# Patient Record
Sex: Female | Born: 1945 | Race: White | Hispanic: No | Marital: Married
Health system: Southern US, Community
[De-identification: ages and names within clinical notes are randomized; demographics above are authoritative.]

## PROBLEM LIST (undated history)

## (undated) HISTORY — PX: ABDOMINAL HYSTERECTOMY: SHX81

---

## 2004-11-24 ENCOUNTER — Ambulatory Visit: Payer: Self-pay | Admitting: Internal Medicine

## 2006-01-04 ENCOUNTER — Ambulatory Visit: Payer: Self-pay | Admitting: Internal Medicine

## 2007-04-04 ENCOUNTER — Ambulatory Visit: Payer: Self-pay | Admitting: Obstetrics and Gynecology

## 2008-06-19 ENCOUNTER — Ambulatory Visit: Payer: Self-pay | Admitting: Family Medicine

## 2009-10-15 ENCOUNTER — Ambulatory Visit: Payer: Self-pay | Admitting: Family Medicine

## 2011-01-15 ENCOUNTER — Ambulatory Visit: Payer: Self-pay | Admitting: Internal Medicine

## 2011-09-12 ENCOUNTER — Ambulatory Visit: Payer: Self-pay | Admitting: Family Medicine

## 2011-09-12 ENCOUNTER — Ambulatory Visit: Payer: Self-pay

## 2011-12-26 ENCOUNTER — Ambulatory Visit: Payer: Self-pay

## 2012-06-06 ENCOUNTER — Ambulatory Visit: Payer: Self-pay | Admitting: Family Medicine

## 2012-06-06 LAB — URINALYSIS, COMPLETE
Bilirubin,UR: NEGATIVE
Ph: 6.5 (ref 4.5–8.0)
Protein: 30

## 2012-06-08 LAB — URINE CULTURE

## 2012-08-09 ENCOUNTER — Ambulatory Visit: Payer: Self-pay | Admitting: Family Medicine

## 2012-08-30 ENCOUNTER — Ambulatory Visit: Payer: Self-pay

## 2012-08-30 LAB — URINALYSIS, COMPLETE
Bilirubin,UR: NEGATIVE
Glucose,UR: NEGATIVE mg/dL (ref 0–75)
Ketone: NEGATIVE
Nitrite: NEGATIVE
Protein: NEGATIVE
Specific Gravity: 1.015 (ref 1.003–1.030)
WBC UR: >30 /HPF (ref 0–5)

## 2012-09-01 LAB — URINE CULTURE

## 2012-12-25 ENCOUNTER — Ambulatory Visit: Payer: Self-pay | Admitting: Emergency Medicine

## 2013-03-22 ENCOUNTER — Ambulatory Visit: Payer: Self-pay | Admitting: Family Medicine

## 2013-03-22 LAB — URINALYSIS, COMPLETE
Glucose,UR: NEGATIVE mg/dL (ref 0–75)
Ph: 7.5 (ref 4.5–8.0)
Specific Gravity: 1.005 (ref 1.003–1.030)

## 2013-03-24 LAB — URINE CULTURE

## 2013-05-17 ENCOUNTER — Ambulatory Visit: Payer: Self-pay | Admitting: Emergency Medicine

## 2013-06-19 ENCOUNTER — Ambulatory Visit: Payer: Self-pay | Admitting: Family Medicine

## 2013-07-02 ENCOUNTER — Ambulatory Visit: Payer: Self-pay | Admitting: Family Medicine

## 2013-10-01 ENCOUNTER — Ambulatory Visit: Payer: Self-pay

## 2013-10-01 LAB — URINALYSIS, COMPLETE
Bilirubin,UR: NEGATIVE
Glucose,UR: NEGATIVE mg/dL (ref 0–75)
Ketone: NEGATIVE
Nitrite: POSITIVE
Ph: 6.5 (ref 4.5–8.0)
Protein: NEGATIVE
Specific Gravity: 1.01 (ref 1.003–1.030)
WBC UR: >30 /HPF (ref 0–5)

## 2013-10-03 LAB — URINE CULTURE

## 2014-03-31 ENCOUNTER — Ambulatory Visit: Payer: Self-pay | Admitting: Emergency Medicine

## 2015-01-19 ENCOUNTER — Ambulatory Visit
Admit: 2015-01-19 | Disposition: A | Discharge status: Home / Self Care | Payer: Self-pay | Attending: Family Medicine | Admitting: Family Medicine

## 2015-07-09 ENCOUNTER — Other Ambulatory Visit: Payer: Self-pay | Admitting: Family Medicine

## 2015-07-09 DIAGNOSIS — Z1231 Encounter for screening mammogram for malignant neoplasm of breast: Secondary | ICD-10-CM

## 2015-07-16 ENCOUNTER — Ambulatory Visit: Payer: Self-pay

## 2015-07-22 ENCOUNTER — Ambulatory Visit
Admission: RE | Admit: 2015-07-22 | Discharge: 2015-07-22 | Disposition: A | Discharge status: Home / Self Care | Payer: Medicare Other | Source: Ambulatory Visit | Attending: Family Medicine | Admitting: Family Medicine

## 2015-07-22 ENCOUNTER — Other Ambulatory Visit: Payer: Self-pay | Admitting: Family Medicine

## 2015-07-22 DIAGNOSIS — Z1231 Encounter for screening mammogram for malignant neoplasm of breast: Secondary | ICD-10-CM

## 2017-06-13 ENCOUNTER — Other Ambulatory Visit: Payer: Self-pay | Admitting: Family Medicine

## 2017-06-13 DIAGNOSIS — Z1231 Encounter for screening mammogram for malignant neoplasm of breast: Secondary | ICD-10-CM

## 2017-06-29 ENCOUNTER — Ambulatory Visit
Admission: RE | Admit: 2017-06-29 | Discharge: 2017-06-29 | Disposition: A | Discharge status: Home / Self Care | Payer: Medicare Other | Source: Ambulatory Visit | Attending: Family Medicine | Admitting: Family Medicine

## 2017-06-29 DIAGNOSIS — Z1231 Encounter for screening mammogram for malignant neoplasm of breast: Secondary | ICD-10-CM | POA: Diagnosis not present

## 2017-06-30 ENCOUNTER — Other Ambulatory Visit: Payer: Self-pay | Admitting: Family Medicine

## 2017-06-30 DIAGNOSIS — R928 Other abnormal and inconclusive findings on diagnostic imaging of breast: Secondary | ICD-10-CM

## 2017-07-13 ENCOUNTER — Ambulatory Visit
Admission: RE | Admit: 2017-07-13 | Discharge: 2017-07-13 | Disposition: A | Discharge status: Home / Self Care | Payer: Medicare Other | Source: Ambulatory Visit | Attending: Family Medicine | Admitting: Family Medicine

## 2017-07-13 ENCOUNTER — Other Ambulatory Visit: Payer: Self-pay | Admitting: Family Medicine

## 2017-07-13 DIAGNOSIS — R921 Mammographic calcification found on diagnostic imaging of breast: Secondary | ICD-10-CM | POA: Diagnosis present

## 2017-07-13 DIAGNOSIS — R928 Other abnormal and inconclusive findings on diagnostic imaging of breast: Secondary | ICD-10-CM

## 2018-02-08 ENCOUNTER — Other Ambulatory Visit: Payer: Self-pay | Admitting: Family Medicine

## 2018-02-08 DIAGNOSIS — R921 Mammographic calcification found on diagnostic imaging of breast: Secondary | ICD-10-CM

## 2018-02-22 ENCOUNTER — Ambulatory Visit
Admission: RE | Admit: 2018-02-22 | Discharge: 2018-02-22 | Disposition: A | Discharge status: Home / Self Care | Payer: Medicare Other | Source: Ambulatory Visit | Attending: Family Medicine | Admitting: Family Medicine

## 2018-02-22 DIAGNOSIS — R921 Mammographic calcification found on diagnostic imaging of breast: Secondary | ICD-10-CM | POA: Diagnosis present

## 2018-08-30 ENCOUNTER — Ambulatory Visit: Payer: Medicare Other | Admitting: Podiatry

## 2018-09-19 ENCOUNTER — Ambulatory Visit: Payer: Medicare Other | Admitting: Podiatry

## 2019-05-28 ENCOUNTER — Other Ambulatory Visit: Payer: Self-pay | Admitting: Family Medicine

## 2019-05-28 DIAGNOSIS — Z1231 Encounter for screening mammogram for malignant neoplasm of breast: Secondary | ICD-10-CM

## 2019-07-01 ENCOUNTER — Ambulatory Visit
Admission: RE | Admit: 2019-07-01 | Discharge: 2019-07-01 | Disposition: A | Discharge status: Home / Self Care | Payer: Medicare HMO | Source: Ambulatory Visit | Attending: Family Medicine | Admitting: Family Medicine

## 2019-07-01 DIAGNOSIS — Z1231 Encounter for screening mammogram for malignant neoplasm of breast: Secondary | ICD-10-CM

## 2019-11-22 ENCOUNTER — Ambulatory Visit: Payer: Medicare HMO | Attending: Internal Medicine

## 2019-11-22 ENCOUNTER — Other Ambulatory Visit: Payer: Self-pay

## 2019-11-22 DIAGNOSIS — Z23 Encounter for immunization: Secondary | ICD-10-CM | POA: Insufficient documentation

## 2019-11-22 NOTE — Progress Notes (Signed)
   Covid-19 Vaccination Clinic  Name:  Sharon Green    MRN: [id number] DOB: [birth year]  11/22/2019  Sharon Green was observed post Covid-19 immunization for 15 minutes without incidence. She was provided with Vaccine Information Sheet and instruction to access the V-Safe system.   Sharon Green was instructed to call 911 with any severe reactions post vaccine: Marland Kitchen Difficulty breathing  . Swelling of your face and throat  . A fast heartbeat  . A bad rash all over your body  . Dizziness and weakness    Immunizations Administered    Name Date Dose VIS Date Route   Moderna COVID-19 Vaccine 11/22/2019  4:35 PM 0.5 mL 09/17/2019 Intramuscular   Manufacturer: Moderna   Lot: [id number]   NDC: 17408-144-81

## 2019-12-24 ENCOUNTER — Ambulatory Visit: Payer: Medicare HMO | Attending: Internal Medicine

## 2019-12-24 DIAGNOSIS — Z23 Encounter for immunization: Secondary | ICD-10-CM | POA: Insufficient documentation

## 2019-12-24 NOTE — Progress Notes (Signed)
   Covid-19 Vaccination Clinic  Name:  Sharon Green    MRN: [id number] DOB: 06-Mar-1946  12/24/2019  [name] was observed post Covid-19 immunization for 15 minutes without incident. She was provided with Vaccine Information Sheet and instruction to access the V-Safe system.   [name] was instructed to call 911 with any severe reactions post vaccine: Marland Kitchen Difficulty breathing  . Swelling of face and throat  . A fast heartbeat  . A bad rash all over body  . Dizziness and weakness   Immunizations Administered    Name Date Dose VIS Date Route   Moderna COVID-19 Vaccine 12/24/2019  1:04 PM 0.5 mL 09/17/2019 Intramuscular   Manufacturer: Moderna   Lot: [id number]   NDC: 31250-871-99

## 2020-11-17 ENCOUNTER — Other Ambulatory Visit: Payer: Self-pay | Admitting: Family Medicine

## 2020-11-17 DIAGNOSIS — Z1231 Encounter for screening mammogram for malignant neoplasm of breast: Secondary | ICD-10-CM

## 2020-12-09 ENCOUNTER — Ambulatory Visit
Admission: RE | Admit: 2020-12-09 | Discharge: 2020-12-09 | Disposition: A | Discharge status: Home / Self Care | Payer: Medicare HMO | Source: Ambulatory Visit | Attending: Family Medicine | Admitting: Family Medicine

## 2020-12-09 ENCOUNTER — Other Ambulatory Visit: Payer: Self-pay

## 2020-12-09 DIAGNOSIS — Z1231 Encounter for screening mammogram for malignant neoplasm of breast: Secondary | ICD-10-CM | POA: Insufficient documentation

## 2021-12-28 ENCOUNTER — Other Ambulatory Visit: Payer: Self-pay | Admitting: Family Medicine

## 2021-12-28 DIAGNOSIS — Z1231 Encounter for screening mammogram for malignant neoplasm of breast: Secondary | ICD-10-CM

## 2021-12-30 ENCOUNTER — Other Ambulatory Visit: Payer: Self-pay

## 2021-12-30 ENCOUNTER — Ambulatory Visit
Admission: RE | Admit: 2021-12-30 | Discharge: 2021-12-30 | Disposition: A | Discharge status: Home / Self Care | Payer: Medicare HMO | Source: Ambulatory Visit | Attending: Family Medicine | Admitting: Family Medicine

## 2021-12-30 DIAGNOSIS — Z1231 Encounter for screening mammogram for malignant neoplasm of breast: Secondary | ICD-10-CM | POA: Insufficient documentation

## 2022-08-18 IMAGING — MG MM DIGITAL SCREENING BILAT W/ TOMO AND CAD
8 series · 8 of 24 positions shown · non-contrast
Comparison: Previous exam(s).

CLINICAL DATA: Screening.

EXAM:
DIGITAL SCREENING BILATERAL MAMMOGRAM WITH TOMOSYNTHESIS AND CAD
TECHNIQUE: Bilateral screening digital craniocaudal and mediolateral oblique
mammograms were obtained. Bilateral screening digital breast
tomosynthesis was performed. The images were evaluated with
computer-aided detection.

[R MLO synth-2D]
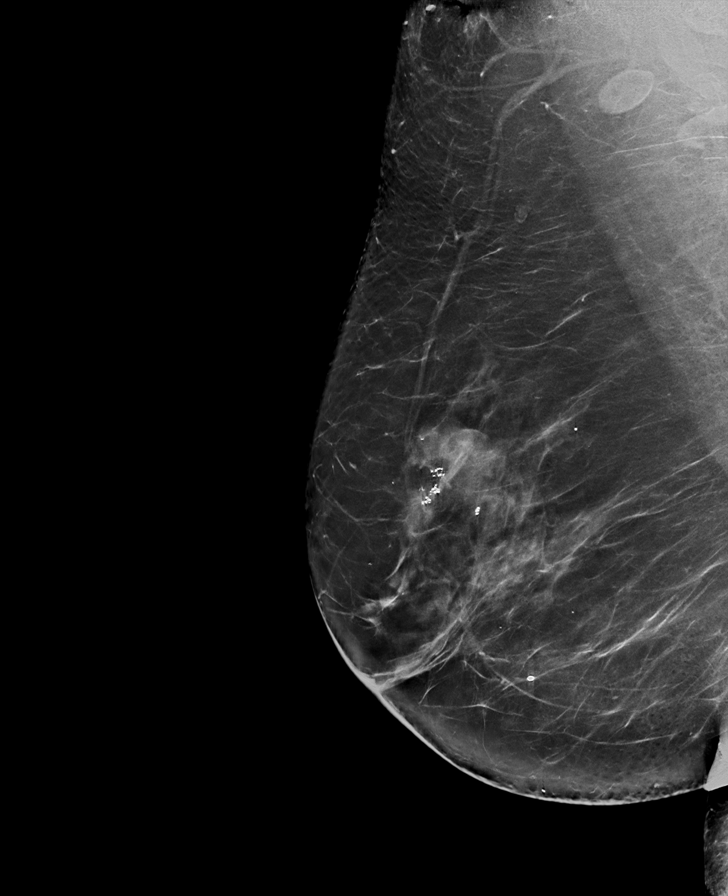

[R CC synth-2D]
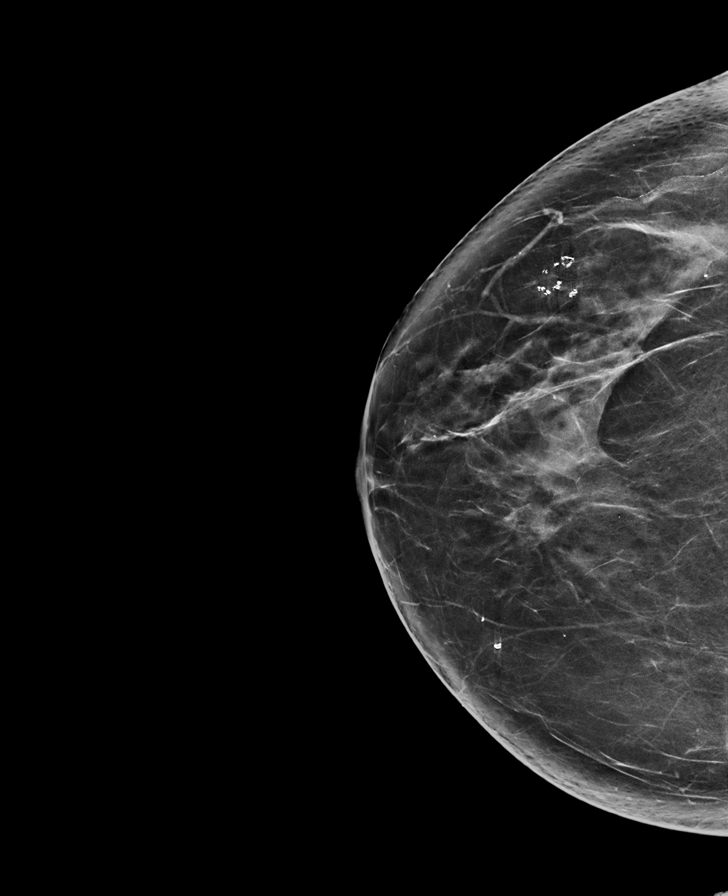

[L MLO synth-2D]
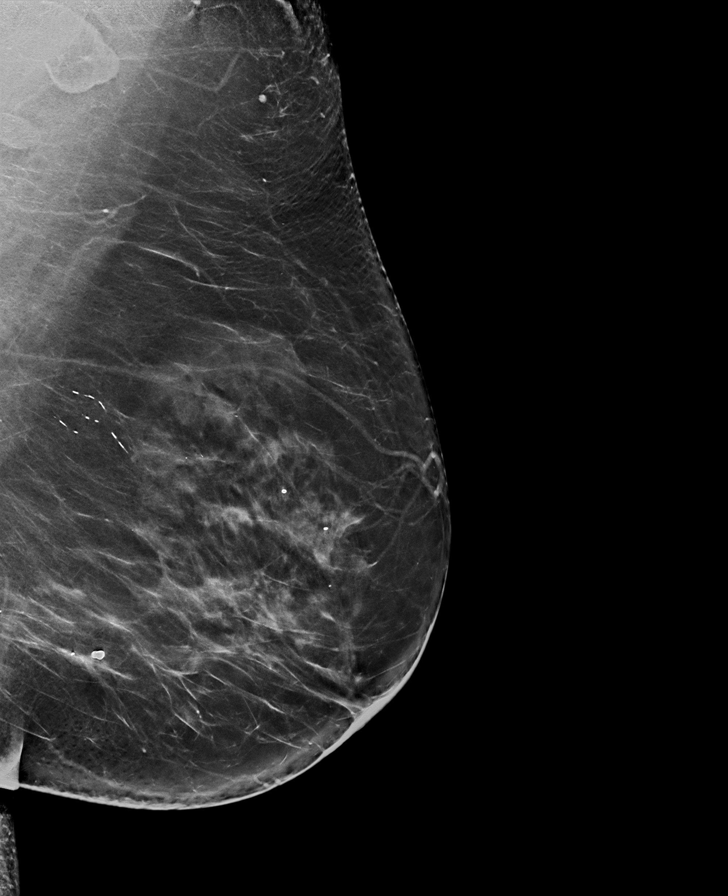

[L CC synth-2D]
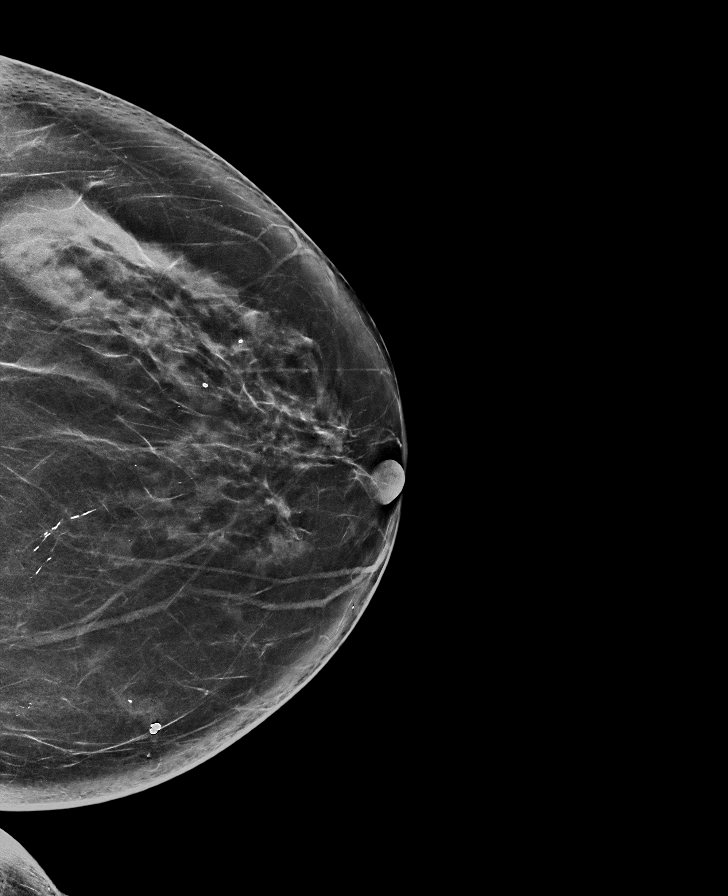

[R CC tomo · tomo slice 41/81.0]
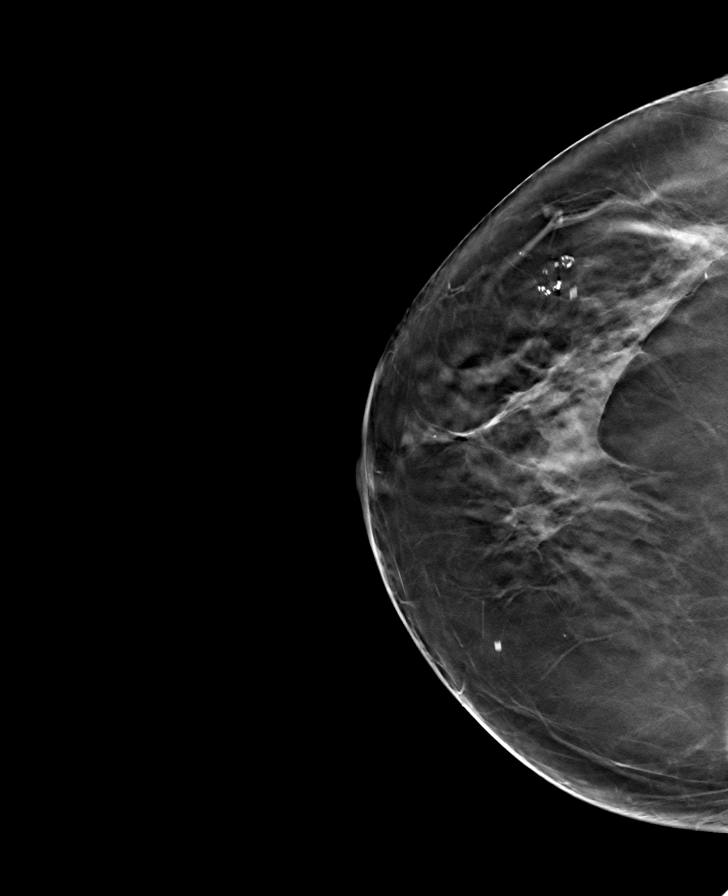

[R MLO tomo · tomo slice 49/96.0]
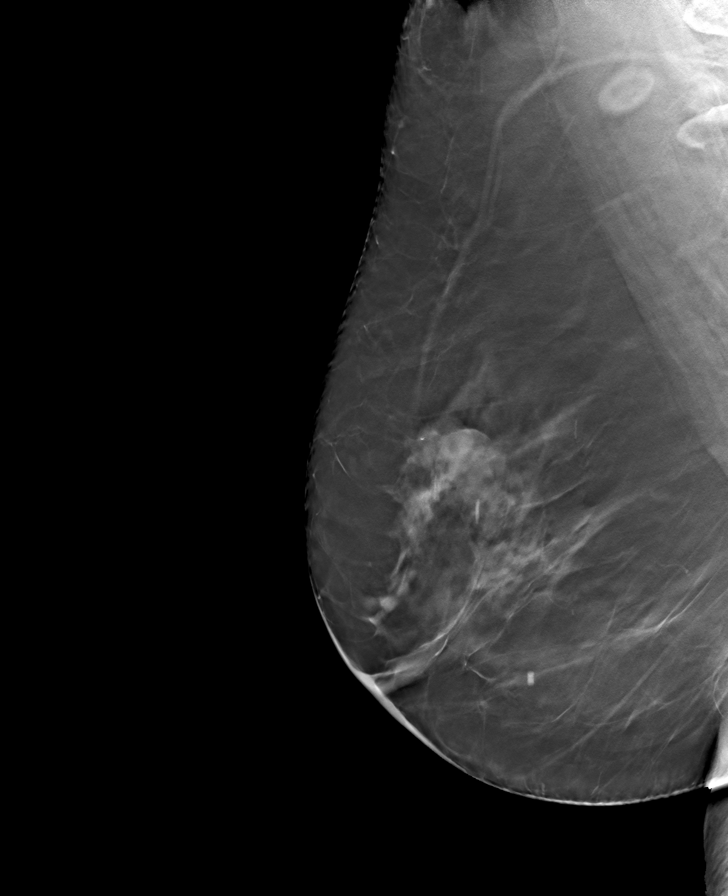

[L MLO tomo · tomo slice 47/94.0]
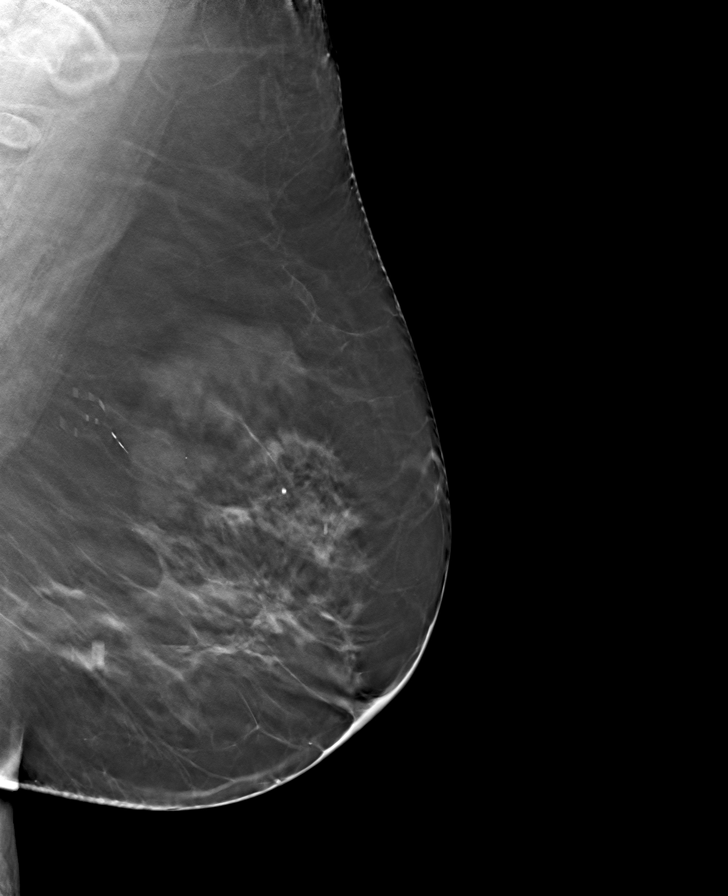

[L CC tomo · tomo slice 43/85.0]
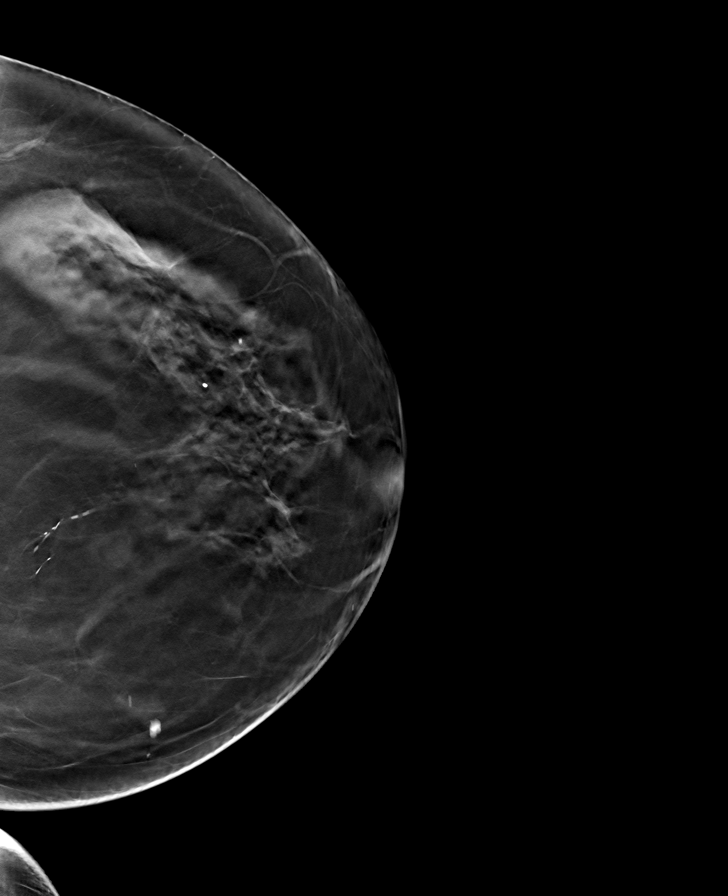

[8 of 24 positions shown; findings below may reference images not displayed]

ACR Breast Density Category c: The breast tissue is heterogeneously
dense, which may obscure small masses.
FINDINGS: There are no findings suspicious for malignancy.
IMPRESSION: No mammographic evidence of malignancy. A result letter of this
screening mammogram will be mailed directly to the patient.

RECOMMENDATION:
Screening mammogram in one year. (Code:[id number])

BI-RADS CATEGORY  1: Negative.

## 2023-02-21 ENCOUNTER — Other Ambulatory Visit: Payer: Self-pay | Admitting: Family Medicine

## 2023-02-21 DIAGNOSIS — Z1231 Encounter for screening mammogram for malignant neoplasm of breast: Secondary | ICD-10-CM

## 2023-02-23 ENCOUNTER — Ambulatory Visit
Admission: RE | Admit: 2023-02-23 | Discharge: 2023-02-23 | Disposition: A | Discharge status: Home / Self Care | Payer: Medicare HMO | Source: Ambulatory Visit | Attending: Family Medicine | Admitting: Family Medicine

## 2023-02-23 DIAGNOSIS — Z1231 Encounter for screening mammogram for malignant neoplasm of breast: Secondary | ICD-10-CM

## 2023-03-01 ENCOUNTER — Other Ambulatory Visit: Payer: Self-pay | Admitting: Family Medicine

## 2023-03-01 DIAGNOSIS — R928 Other abnormal and inconclusive findings on diagnostic imaging of breast: Secondary | ICD-10-CM

## 2023-03-01 DIAGNOSIS — R921 Mammographic calcification found on diagnostic imaging of breast: Secondary | ICD-10-CM

## 2023-03-02 ENCOUNTER — Ambulatory Visit
Admission: RE | Admit: 2023-03-02 | Discharge: 2023-03-02 | Disposition: A | Discharge status: Home / Self Care | Payer: Medicare HMO | Source: Ambulatory Visit | Attending: Family Medicine | Admitting: Family Medicine

## 2023-03-02 DIAGNOSIS — R928 Other abnormal and inconclusive findings on diagnostic imaging of breast: Secondary | ICD-10-CM | POA: Diagnosis not present

## 2023-03-02 DIAGNOSIS — R921 Mammographic calcification found on diagnostic imaging of breast: Secondary | ICD-10-CM | POA: Insufficient documentation

## 2023-03-06 ENCOUNTER — Other Ambulatory Visit: Payer: Self-pay | Admitting: Family Medicine

## 2023-03-06 DIAGNOSIS — R921 Mammographic calcification found on diagnostic imaging of breast: Secondary | ICD-10-CM

## 2023-03-06 DIAGNOSIS — R928 Other abnormal and inconclusive findings on diagnostic imaging of breast: Secondary | ICD-10-CM

## 2023-03-08 ENCOUNTER — Ambulatory Visit
Admission: RE | Admit: 2023-03-08 | Discharge: 2023-03-08 | Disposition: A | Discharge status: Home / Self Care | Payer: Medicare HMO | Source: Ambulatory Visit | Attending: Family Medicine | Admitting: Family Medicine

## 2023-03-08 DIAGNOSIS — R928 Other abnormal and inconclusive findings on diagnostic imaging of breast: Secondary | ICD-10-CM | POA: Insufficient documentation

## 2023-03-08 DIAGNOSIS — R921 Mammographic calcification found on diagnostic imaging of breast: Secondary | ICD-10-CM | POA: Insufficient documentation

## 2023-03-08 HISTORY — PX: BREAST BIOPSY: SHX20

## 2023-03-08 MED ORDER — LIDOCAINE HCL (PF) 1 % IJ SOLN
5.0000 mL | Freq: Once | INTRAMUSCULAR | Status: AC
  Administered 2023-03-08: 5 mL

## 2023-03-08 MED ORDER — LIDOCAINE-EPINEPHRINE 1 %-1:100000 IJ SOLN
10.0000 mL | Freq: Once | INTRAMUSCULAR | Status: AC
  Administered 2023-03-08: 10 mL

## 2023-03-09 LAB — SURGICAL PATHOLOGY

## 2024-01-24 ENCOUNTER — Other Ambulatory Visit: Payer: Self-pay | Admitting: Family Medicine

## 2024-01-24 DIAGNOSIS — Z1231 Encounter for screening mammogram for malignant neoplasm of breast: Secondary | ICD-10-CM

## 2024-03-12 ENCOUNTER — Ambulatory Visit
Admission: RE | Admit: 2024-03-12 | Discharge: 2024-03-12 | Disposition: A | Discharge status: Home / Self Care | Source: Ambulatory Visit | Attending: Family Medicine | Admitting: Family Medicine

## 2024-03-12 DIAGNOSIS — Z1231 Encounter for screening mammogram for malignant neoplasm of breast: Secondary | ICD-10-CM | POA: Diagnosis present
